# Patient Record
Sex: Male | Born: 1950 | Race: Black or African American | Hispanic: No | Marital: Married
Health system: Southern US, Community
[De-identification: ages and names within clinical notes are randomized; demographics above are authoritative.]

---

## 2001-11-25 ENCOUNTER — Ambulatory Visit (HOSPITAL_COMMUNITY): Admission: RE | Admit: 2001-11-25 | Discharge: 2001-11-25 | Payer: Self-pay | Admitting: *Deleted

## 2009-01-27 ENCOUNTER — Encounter: Admission: RE | Admit: 2009-01-27 | Discharge: 2009-01-27 | Payer: Self-pay | Admitting: Family Medicine

## 2012-07-04 ENCOUNTER — Telehealth: Payer: Self-pay | Admitting: Diagnostic Neuroimaging

## 2012-07-04 NOTE — Telephone Encounter (Deleted)
Pt requesting MRI results.

## 2012-07-05 NOTE — Telephone Encounter (Signed)
I called pt. Reviewed results. Conservative mgmt for now. Labs unremarkable. MRI lumbar spine shows multi-level disc bulging and biforaminal foraminal stenosis.   He may f/u in 6 months or as needed.

## 2012-11-19 ENCOUNTER — Ambulatory Visit: Payer: Self-pay | Admitting: Diagnostic Neuroimaging

## 2016-04-19 DIAGNOSIS — Z125 Encounter for screening for malignant neoplasm of prostate: Secondary | ICD-10-CM | POA: Diagnosis not present

## 2016-04-19 DIAGNOSIS — R7301 Impaired fasting glucose: Secondary | ICD-10-CM | POA: Diagnosis not present

## 2016-04-19 DIAGNOSIS — Z0000 Encounter for general adult medical examination without abnormal findings: Secondary | ICD-10-CM | POA: Diagnosis not present

## 2016-04-19 DIAGNOSIS — E785 Hyperlipidemia, unspecified: Secondary | ICD-10-CM | POA: Diagnosis not present

## 2016-04-19 DIAGNOSIS — R7611 Nonspecific reaction to tuberculin skin test without active tuberculosis: Secondary | ICD-10-CM | POA: Diagnosis not present

## 2016-04-19 DIAGNOSIS — Z23 Encounter for immunization: Secondary | ICD-10-CM | POA: Diagnosis not present

## 2016-06-02 DIAGNOSIS — K640 First degree hemorrhoids: Secondary | ICD-10-CM | POA: Diagnosis not present

## 2016-06-02 DIAGNOSIS — Z1211 Encounter for screening for malignant neoplasm of colon: Secondary | ICD-10-CM | POA: Diagnosis not present

## 2017-11-07 DIAGNOSIS — Z125 Encounter for screening for malignant neoplasm of prostate: Secondary | ICD-10-CM | POA: Diagnosis not present

## 2017-11-07 DIAGNOSIS — E785 Hyperlipidemia, unspecified: Secondary | ICD-10-CM | POA: Diagnosis not present

## 2017-11-07 DIAGNOSIS — Z23 Encounter for immunization: Secondary | ICD-10-CM | POA: Diagnosis not present

## 2017-11-07 DIAGNOSIS — Z1159 Encounter for screening for other viral diseases: Secondary | ICD-10-CM | POA: Diagnosis not present

## 2017-11-07 DIAGNOSIS — Z0000 Encounter for general adult medical examination without abnormal findings: Secondary | ICD-10-CM | POA: Diagnosis not present

## 2017-11-07 DIAGNOSIS — R7303 Prediabetes: Secondary | ICD-10-CM | POA: Diagnosis not present

## 2017-11-07 DIAGNOSIS — Z6837 Body mass index (BMI) 37.0-37.9, adult: Secondary | ICD-10-CM | POA: Diagnosis not present

## 2018-11-27 DIAGNOSIS — E785 Hyperlipidemia, unspecified: Secondary | ICD-10-CM | POA: Diagnosis not present

## 2018-11-27 DIAGNOSIS — Z0000 Encounter for general adult medical examination without abnormal findings: Secondary | ICD-10-CM | POA: Diagnosis not present

## 2018-11-27 DIAGNOSIS — R7303 Prediabetes: Secondary | ICD-10-CM | POA: Diagnosis not present

## 2018-11-27 DIAGNOSIS — Z125 Encounter for screening for malignant neoplasm of prostate: Secondary | ICD-10-CM | POA: Diagnosis not present

## 2018-11-28 DIAGNOSIS — E785 Hyperlipidemia, unspecified: Secondary | ICD-10-CM | POA: Diagnosis not present

## 2018-11-28 DIAGNOSIS — Z125 Encounter for screening for malignant neoplasm of prostate: Secondary | ICD-10-CM | POA: Diagnosis not present

## 2018-11-28 DIAGNOSIS — Z0000 Encounter for general adult medical examination without abnormal findings: Secondary | ICD-10-CM | POA: Diagnosis not present

## 2018-11-28 DIAGNOSIS — R7303 Prediabetes: Secondary | ICD-10-CM | POA: Diagnosis not present

## 2018-12-20 DIAGNOSIS — Z23 Encounter for immunization: Secondary | ICD-10-CM | POA: Diagnosis not present

## 2020-12-20 ENCOUNTER — Ambulatory Visit
Admission: RE | Admit: 2020-12-20 | Discharge: 2020-12-20 | Disposition: A | Discharge status: Home / Self Care | Payer: Self-pay | Source: Ambulatory Visit | Attending: Family Medicine | Admitting: Family Medicine

## 2020-12-20 ENCOUNTER — Other Ambulatory Visit: Payer: Self-pay | Admitting: Family Medicine

## 2020-12-20 DIAGNOSIS — R7303 Prediabetes: Secondary | ICD-10-CM | POA: Diagnosis not present

## 2020-12-20 DIAGNOSIS — Z0000 Encounter for general adult medical examination without abnormal findings: Secondary | ICD-10-CM | POA: Diagnosis not present

## 2020-12-20 DIAGNOSIS — Z9289 Personal history of other medical treatment: Secondary | ICD-10-CM

## 2020-12-20 DIAGNOSIS — E7800 Pure hypercholesterolemia, unspecified: Secondary | ICD-10-CM | POA: Diagnosis not present

## 2020-12-20 DIAGNOSIS — M25561 Pain in right knee: Secondary | ICD-10-CM | POA: Diagnosis not present

## 2020-12-20 DIAGNOSIS — R030 Elevated blood-pressure reading, without diagnosis of hypertension: Secondary | ICD-10-CM | POA: Diagnosis not present

## 2020-12-20 DIAGNOSIS — J439 Emphysema, unspecified: Secondary | ICD-10-CM | POA: Diagnosis not present

## 2020-12-20 DIAGNOSIS — Z1389 Encounter for screening for other disorder: Secondary | ICD-10-CM | POA: Diagnosis not present

## 2020-12-20 DIAGNOSIS — Z1211 Encounter for screening for malignant neoplasm of colon: Secondary | ICD-10-CM | POA: Diagnosis not present

## 2021-01-03 DIAGNOSIS — R3912 Poor urinary stream: Secondary | ICD-10-CM | POA: Diagnosis not present

## 2021-01-03 DIAGNOSIS — Z23 Encounter for immunization: Secondary | ICD-10-CM | POA: Diagnosis not present

## 2021-01-05 ENCOUNTER — Ambulatory Visit: Payer: Self-pay

## 2021-01-05 ENCOUNTER — Other Ambulatory Visit: Payer: Self-pay

## 2021-01-05 ENCOUNTER — Ambulatory Visit: Payer: Medicare Other | Admitting: Orthopedic Surgery

## 2021-01-05 ENCOUNTER — Encounter: Payer: Self-pay | Admitting: Orthopedic Surgery

## 2021-01-05 VITALS — Ht 69.5 in | Wt 222.0 lb

## 2021-01-05 DIAGNOSIS — M25561 Pain in right knee: Secondary | ICD-10-CM | POA: Diagnosis not present

## 2021-01-05 DIAGNOSIS — M1711 Unilateral primary osteoarthritis, right knee: Secondary | ICD-10-CM

## 2021-01-05 NOTE — Progress Notes (Signed)
Office Visit Note   Patient: Gordon Jones           Date of Birth: [birth year]           MRN: [id number] Visit Date: 01/05/2021 Requested by: Antony Contras, MD [place],  [place] [address] PCP: Antony Contras, MD  Subjective: Chief Complaint  Patient presents with   Right Knee - Pain    HPI: Gordon Jones is a 70 y.o. male who presents to the office complaining of right knee pain.  Report pain is been ongoing for 1 year.  Localizes pain to the medial aspect of the knee.  Denies mechanical symptoms or feelings of instability.  Denies any groin pain or low back pain with radicular symptoms.  Patient takes over-the-counter Tylenol as well as topical Voltaren with little relief.  Patient reports history of no prior knee surgery, injection, physical therapy.  Pain does not wake them up at night and does not bother them at rest.  They deny any history of diabetes, smoking, diabetes, blood thinner use, steroids, CKD..  Additionally, patient enjoys walking as well as going to car shows.  He has a vintage car that he enjoys working on.  He walks with a group of friends and also go to car shows with him.  He does not have any problems that limit his activity but he does note swelling and pain that depends on his activity.              ROS:  All systems reviewed are negative as they relate to the chief complaint within the history of present illness.  Patient denies fevers or chills.  Assessment & Plan: Visit Diagnoses:  1. Right knee pain, unspecified chronicity     Plan: Gordon Jones is a 70 y.o. male who presents to the office complaining of right knee pain.  Pain began 1 year ago when he missed a step while walking down stairs and began to have right medial knee pain and swelling.  He notes swelling immediately after the initial injury.  His pain did somewhat improve over time to the point where he did not consider further intervention.  However now that the  symptoms have been ongoing for a year, he is somewhat concerned and would like further work-up.  Radiographs taken today do demonstrate medial and patellofemoral compartment osteoarthritis rated moderate.  He has a large effusion on exam.   Discussed the options available to patient.  After discussion he would like to try injection today.  Tolerated the injection well.  35 cc of inflammatory joint fluid was aspirated.  Preapproved patient for right knee gel injection.  Follow-up in 3 months for clinical recheck with repeat cortisone injection versus gel injection at that time.  This patient is diagnosed with osteoarthritis of the knee(s).    Radiographs show evidence of joint space narrowing, osteophytes, subchondral sclerosis and/or subchondral cysts.  This patient has knee pain which interferes with functional and activities of daily living.    This patient has experienced inadequate response, adverse effects and/or intolerance with conservative treatments such as acetaminophen, NSAIDS, topical creams, physical therapy or regular exercise, knee bracing and/or weight loss.   This patient has experienced inadequate response or has a contraindication to intra articular steroid injections for at least 3 months.   This patient is not scheduled to have a total knee replacement within 6 months of starting treatment with viscosupplementation.   Follow-Up Instructions: No follow-ups  on file.   Orders:  Orders Placed This Encounter  Procedures   XR Knee 1-2 Views Right   No orders of the defined types were placed in this encounter.     Procedures: Large Joint Inj: R knee on 01/05/2021 12:23 PM Indications: diagnostic evaluation, joint swelling and pain Details: 18 G 1.5 in needle, superolateral approach  Arthrogram: No  Medications: 5 mL lidocaine 1 %; 40 mg methylPREDNISolone acetate 40 MG/ML; 4 mL bupivacaine 0.25 % Aspirate: 35 mL Outcome: tolerated well, no immediate  complications Procedure, treatment alternatives, risks and benefits explained, specific risks discussed. Consent was given by the patient. Immediately prior to procedure a time out was called to verify the correct patient, procedure, equipment, support staff and site/side marked as required. Patient was prepped and draped in the usual sterile fashion.      Clinical Data: No additional findings.  Objective: Vital Signs: Ht 5' 9.5" (1.765 m)   Wt 222 lb (100.7 kg)   BMI 32.31 kg/m   Physical Exam:  Constitutional: Patient appears well-developed HEENT:  Head: Normocephalic Eyes:EOM are normal Neck: Normal range of motion Cardiovascular: Normal rate Pulmonary/chest: Effort normal Neurologic: Patient is alert Skin: Skin is warm Psychiatric: Patient has normal mood and affect  Ortho Exam:  Right knee Exam Moderate effusion Extensor mechanism intact Tender to palpation moderately over the medial joint line No TTP over the lateral jointlines, quad tendon, patellar tendon, pes anserinus, patella, tibial tubercle, LCL/MCL insertions Stable to varus/valgus stresses.  Stable to anterior/posterior drawer Extension to 0 degrees Flexion to 120 degrees No pain with hip range of motion.  Negative straight leg raise.  Specialty Comments:  No specialty comments available.  Imaging: No results found.   PMFS History: There are no problems to display for this patient.  No past medical history on file.  No family history on file.   Social History   Occupational History   Not on file  Tobacco Use   Smoking status: Not on file   Smokeless tobacco: Not on file  Substance and Sexual Activity   Alcohol use: Not on file   Drug use: Not on file   Sexual activity: Not on file

## 2021-01-06 ENCOUNTER — Telehealth: Payer: Self-pay

## 2021-01-06 NOTE — Telephone Encounter (Signed)
Noted  

## 2021-01-06 NOTE — Telephone Encounter (Signed)
Can we get patient approved for right knee gel injection? 

## 2021-01-10 MED ORDER — BUPIVACAINE HCL 0.25 % IJ SOLN
4.0000 mL | INTRAMUSCULAR | Status: AC | PRN
  Administered 2021-01-05: 4 mL via INTRA_ARTICULAR

## 2021-01-10 MED ORDER — METHYLPREDNISOLONE ACETATE 40 MG/ML IJ SUSP
40.0000 mg | INTRAMUSCULAR | Status: AC | PRN
  Administered 2021-01-05: 40 mg via INTRA_ARTICULAR

## 2021-01-10 MED ORDER — LIDOCAINE HCL 1 % IJ SOLN
5.0000 mL | INTRAMUSCULAR | Status: AC | PRN
  Administered 2021-01-05: 5 mL

## 2021-03-02 ENCOUNTER — Telehealth: Payer: Self-pay

## 2021-03-02 NOTE — Telephone Encounter (Signed)
VOB submitted for  Durolane, right knee. °BV pending. °

## 2021-03-07 ENCOUNTER — Telehealth: Payer: Self-pay

## 2021-03-07 NOTE — Telephone Encounter (Addendum)
Approved for Durolane, right knee. [place] Patient will be responsible for 20% OOP. Co-pay of $20.00 No PA required  Appt. 04/13/2021

## 2021-04-13 ENCOUNTER — Encounter: Payer: Self-pay | Admitting: Orthopedic Surgery

## 2021-04-13 ENCOUNTER — Ambulatory Visit: Payer: Medicare Other | Admitting: Orthopedic Surgery

## 2021-04-13 ENCOUNTER — Other Ambulatory Visit: Payer: Self-pay

## 2021-04-13 DIAGNOSIS — M1711 Unilateral primary osteoarthritis, right knee: Secondary | ICD-10-CM | POA: Diagnosis not present

## 2021-04-13 MED ORDER — LIDOCAINE HCL 1 % IJ SOLN
5.0000 mL | INTRAMUSCULAR | Status: AC | PRN
  Administered 2021-04-13: 5 mL

## 2021-04-13 MED ORDER — SODIUM HYALURONATE 60 MG/3ML IX PRSY
60.0000 mg | PREFILLED_SYRINGE | INTRA_ARTICULAR | Status: AC | PRN
  Administered 2021-04-13: 60 mg via INTRA_ARTICULAR

## 2021-04-13 NOTE — Progress Notes (Signed)
° °  Procedure Note  Patient: BRAELIN COSTLOW             Date of Birth: November 05, 1950           MRN: [id number]             Visit Date: 04/13/2021  Procedures: Visit Diagnoses:  1. Unilateral primary osteoarthritis, right knee     Large Joint Inj: R knee on 04/13/2021 1:21 PM Indications: diagnostic evaluation, joint swelling and pain Details: 18 G 1.5 in needle, superolateral approach  Arthrogram: No  Medications: 5 mL lidocaine 1 %; 60 mg Sodium Hyaluronate 60 MG/3ML Outcome: tolerated well, no immediate complications Procedure, treatment alternatives, risks and benefits explained, specific risks discussed. Consent was given by the patient. Immediately prior to procedure a time out was called to verify the correct patient, procedure, equipment, support staff and site/side marked as required. Patient was prepped and draped in the usual sterile fashion.   Zyeir is a patient with right knee pain and medial compartment arthritis.  Here for Durolane injection.  He does walk a lot.  Does not want knee replacement.  Pain is not unbearable.  Plan for Durolane injection today with aspiration of about 35 cc.  Come back in 3 months for repeat aspiration and cortisone injection

## 2021-04-29 ENCOUNTER — Telehealth: Payer: Self-pay | Admitting: Radiation Oncology

## 2021-04-29 NOTE — Telephone Encounter (Signed)
Called patient to schedule a consultation w. Dr. Manning. No answer, LVM for a return call.  ?

## 2021-05-19 NOTE — Progress Notes (Signed)
GU Location of Tumor / Histology: Prostate Ca  If Prostate Cancer, Gleason Score is (4 + 3) and PSA is (4.18 as of 12/2020).  Biopsies  Dr. Lovena Neighbours     Past/Anticipated interventions by urology, if any:   Past/Anticipated interventions by medical oncology, if any:   Weight changes, if any:  No  IPSS:  5 SHIM:   7  Bowel/Bladder complaints, if any:  No bowel or bladder  Nausea/Vomiting, if any: No  Pain issues, if any:  0/10  SAFETY ISSUES: Prior radiation? No Pacemaker/ICD?  No Possible current pregnancy?  Male Is the patient on methotrexate? No  Current Complaints / other details:

## 2021-05-23 ENCOUNTER — Ambulatory Visit
Admission: RE | Admit: 2021-05-23 | Discharge: 2021-05-23 | Disposition: A | Discharge status: Home / Self Care | Payer: Medicare Other | Source: Ambulatory Visit | Attending: Radiation Oncology | Admitting: Radiation Oncology

## 2021-05-23 ENCOUNTER — Other Ambulatory Visit: Payer: Self-pay

## 2021-05-23 VITALS — BP 165/81 | HR 71 | Temp 98.3°F | Resp 18 | Ht 70.0 in | Wt 237.6 lb

## 2021-05-23 DIAGNOSIS — Z79899 Other long term (current) drug therapy: Secondary | ICD-10-CM | POA: Diagnosis not present

## 2021-05-23 DIAGNOSIS — E785 Hyperlipidemia, unspecified: Secondary | ICD-10-CM | POA: Insufficient documentation

## 2021-05-23 DIAGNOSIS — Z6837 Body mass index (BMI) 37.0-37.9, adult: Secondary | ICD-10-CM | POA: Insufficient documentation

## 2021-05-23 DIAGNOSIS — C61 Malignant neoplasm of prostate: Secondary | ICD-10-CM

## 2021-05-23 DIAGNOSIS — R7611 Nonspecific reaction to tuberculin skin test without active tuberculosis: Secondary | ICD-10-CM | POA: Insufficient documentation

## 2021-05-23 DIAGNOSIS — R7301 Impaired fasting glucose: Secondary | ICD-10-CM | POA: Insufficient documentation

## 2021-05-23 DIAGNOSIS — E7800 Pure hypercholesterolemia, unspecified: Secondary | ICD-10-CM | POA: Insufficient documentation

## 2021-05-23 DIAGNOSIS — R7303 Prediabetes: Secondary | ICD-10-CM | POA: Insufficient documentation

## 2021-05-23 DIAGNOSIS — R209 Unspecified disturbances of skin sensation: Secondary | ICD-10-CM | POA: Insufficient documentation

## 2021-05-23 NOTE — Progress Notes (Signed)
Introduced myself to the patient as the prostate nurse navigator.  No barriers to care identified at this time.  He is here to discuss his radiation treatment options.  I gave him my business card and asked him to call me with questions or concerns.  Verbalized understanding.  ?

## 2021-05-23 NOTE — Progress Notes (Signed)
Radiation Oncology         [phone] ________________________________  Initial Outpatient Consultation  Name: Gordon Jones MRN: [id number]  Date: 05/23/2021  DOB: [birth year]  JS:[email or web address], Shanon Brow, MD  [facility]ourd*   REFERRING PHYSICIAN: [facility]ourd*  DIAGNOSIS: 71 y.o. gentleman with Stage T1c adenocarcinoma of the prostate with Gleason score of 3+4, and PSA of 4.18.    ICD-10-CM   1. Prostate cancer [facility])  C61       HISTORY OF PRESENT ILLNESS: Gordon Jones is a 71 y.o. male with a diagnosis of prostate cancer. He was noted to have an elevated PSA of 4.18 by his primary care physician, Dr. Moreen Fowler.  Accordingly, he was referred for evaluation in urology by Dr. Lovena Neighbours on 01/03/21,  digital rectal examination was performed at that time revealing no nodules.  The patient proceeded to transrectal ultrasound with 12 biopsies of the prostate on 04/21/21.  The prostate volume measured 46 cc.  Out of 12 core biopsies, 3 were positive.  The maximum Gleason score was 3+4, and this was seen in Left lateral base, left base and left mid.  The patient reviewed the biopsy results with his urologist and he has kindly been referred today for discussion of potential radiation treatment options.   PREVIOUS RADIATION THERAPY: No  PAST MEDICAL HISTORY: No past medical history on file.    PAST SURGICAL HISTORY:No past surgical history on file.  FAMILY HISTORY: No family history on file.  SOCIAL HISTORY:  Social History   Socioeconomic History   Marital status: Married    Spouse name: Not on file   Number of children: Not on file   Years of education: Not on file   Highest education level: Not on file  Occupational History   Not on file  Tobacco Use   Smoking status: Not on file   Smokeless tobacco: Not on file  Substance and Sexual Activity   Alcohol use: Not on file   Drug use: Not on file   Sexual activity: Not on file  Other Topics Concern   Not on file   Social History Narrative   Not on file   Social Determinants of Health   Financial Resource Strain: Not on file  Food Insecurity: Not on file  Transportation Needs: Not on file  Physical Activity: Not on file  Stress: Not on file  Social Connections: Not on file  Intimate Partner Violence: Not on file    ALLERGIES: Simvastatin  MEDICATIONS:  Current Outpatient Medications  Medication Sig Dispense Refill   aspirin (ASPIRIN LOW DOSE) 81 MG EC tablet 1 tablet     atorvastatin (LIPITOR) 10 MG tablet Take 10 mg by mouth daily.     Multiple Vitamins-Minerals (MULTIVITAMINS/MINERALS ADULT PO) 1 tablet     tadalafil (CIALIS) 5 MG tablet SMARTSIG:1-4 Tablet(s) By Mouth PRN     tamsulosin (FLOMAX) 0.4 MG CAPS capsule Take 0.4 mg by mouth daily.     No current facility-administered medications for this encounter.    REVIEW OF SYSTEMS:  On review of systems, the patient reports that he is doing well overall. He denies any chest pain, shortness of breath, cough, fevers, chills, night sweats, unintended weight changes. He denies any bowel disturbances, and denies abdominal pain, nausea or vomiting. He denies any new musculoskeletal or joint aches or pains. His IPSS was Total Score: 5, indicating mild urinary symptoms. His SHIM: 7, indicating he has erectile dysfunction. A complete review of systems is obtained  and is otherwise negative.   PHYSICAL EXAM:  Wt Readings from Last 3 Encounters:  05/23/21 237 lb 9.6 oz (107.8 kg)  01/05/21 222 lb (100.7 kg)   Temp Readings from Last 3 Encounters:  05/23/21 98.3 F (36.8 C) (Oral)   BP Readings from Last 3 Encounters:  05/23/21 (!) 165/81   Pulse Readings from Last 3 Encounters:  05/23/21 71   Pain Assessment Pain Score: 0-No pain/10  In general this is a well appearing gentleman in no acute distress. He's alert and oriented x4 and appropriate throughout the examination. Cardiopulmonary assessment is negative for acute distress, and he  exhibits normal effort.    KPS = 100  100 - Normal; no complaints; no evidence of disease. 90   - Able to carry on normal activity; minor signs or symptoms of disease. 80   - Normal activity with effort; some signs or symptoms of disease. 56   - Cares for self; unable to carry on normal activity or to do active work. 60   - Requires occasional assistance, but is able to care for most of his personal needs. 50   - Requires considerable assistance and frequent medical care. 63   - Disabled; requires special care and assistance. 49   - Severely disabled; hospital admission is indicated although death not imminent. 37   - Very sick; hospital admission necessary; active supportive treatment necessary. 10   - Moribund; fatal processes progressing rapidly. 0     - Dead  Karnofsky DA, Abelmann WH, Craver LS and Burchenal JH [phone]) The use of the nitrogen mustards in the palliative treatment of carcinoma: with particular reference to bronchogenic carcinoma Cancer 1 634-56  LABORATORY DATA:  No results found for: WBC, HGB, HCT, MCV, PLT No results found for: NA, K, CL, CO2 No results found for: ALT, AST, GGT, ALKPHOS, BILITOT   RADIOGRAPHY: No results found.    IMPRESSION/PLAN: 1. 71 y.o. gentleman with Stage T1c adenocarcinoma of the prostate with Gleason score of 3+4, and PSA of 4.18.  We discussed the patient's workup and outlined the nature of prostate cancer in this setting. The patient's T stage, Gleason's score, and PSA put him into the Favorable Intermediate risk group. Accordingly, he is eligible for a variety of potential treatment options including brachytherapy, 5.5 weeks of external radiation, or prostatectomy. We discussed the available radiation techniques, and focused on the details and logistics of delivery.  We discussed and outlined the risks, benefits, short and long-term effects associated with radiotherapy and compared and contrasted these with prostatectomy. We discussed the  role of SpaceOAR gel in reducing the rectal toxicity associated with radiotherapy.   He appears to have a good understanding of his disease and our treatment recommendations which are of curative intent.  He was encouraged to ask questions that were answered to his stated satisfaction.  At the conclusion of our conversation, the patient is interested in moving forward with urology consult with Dr. Louis Meckel on 06/06/20.  He'll be ready to decide after that.  We personally spent 60 minutes in this encounter including chart review, reviewing radiological studies, meeting face-to-face with the patient, entering orders and completing documentation.      Tyler Pita, MD  [facility]   Radiation Oncology Direct Dial: [phone]   Fax: [phone] [email or web address]   Skype   LinkedIn        I don

## 2021-06-14 NOTE — Progress Notes (Signed)
Left message for call back regarding treatment decision.   ?

## 2021-06-17 ENCOUNTER — Other Ambulatory Visit: Payer: Self-pay

## 2021-06-17 DIAGNOSIS — C61 Malignant neoplasm of prostate: Secondary | ICD-10-CM

## 2021-06-20 NOTE — Progress Notes (Signed)
Patient has decided to proceed with referral to [facility] for high dose brachytherapy with Dr. Tharon Aquas.  Referral faxed successfully to [phone].  Pt aware, RN will follow up to ensure appointment is made.  ?

## 2021-06-24 NOTE — Progress Notes (Signed)
RN placed call to Dr. Corinna Gab office at [facility] to check on status of referral for HDR.  ? ?Message sent to New Patient Coordinator, and referral re-faxed per requested from staff.   ? ?Dr. Corinna Gab office will call back once reviewed and scheduled.   ? ?Will continue to follow up.  ?

## 2021-06-27 NOTE — Progress Notes (Signed)
Patient scheduled/aware of HDR Consult with Dr. Caren Griffins on 07/05/2021.  ? ?Patient agreeable with RN following up after consult to ensure final treatment decision.   No further needs at this time.  ?

## 2021-07-05 DIAGNOSIS — Z888 Allergy status to other drugs, medicaments and biological substances status: Secondary | ICD-10-CM | POA: Diagnosis not present

## 2021-07-07 NOTE — Progress Notes (Signed)
Patient has consult with Dr. Caren Griffins @ [facility] on 3/28 to be consider for HDR brachytherapy.  ? ?RN left voicemail for call back to review treatment decision.  ?

## 2021-07-13 ENCOUNTER — Ambulatory Visit (INDEPENDENT_AMBULATORY_CARE_PROVIDER_SITE_OTHER): Payer: Medicare Other | Admitting: Surgical

## 2021-07-13 ENCOUNTER — Encounter: Payer: Self-pay | Admitting: Orthopedic Surgery

## 2021-07-13 DIAGNOSIS — M1711 Unilateral primary osteoarthritis, right knee: Secondary | ICD-10-CM

## 2021-07-13 MED ORDER — LIDOCAINE HCL 1 % IJ SOLN
5.0000 mL | INTRAMUSCULAR | Status: AC | PRN
  Administered 2021-07-13: 5 mL

## 2021-07-13 MED ORDER — METHYLPREDNISOLONE ACETATE 40 MG/ML IJ SUSP
40.0000 mg | INTRAMUSCULAR | Status: AC | PRN
  Administered 2021-07-13: 40 mg via INTRA_ARTICULAR

## 2021-07-13 MED ORDER — BUPIVACAINE HCL 0.25 % IJ SOLN
4.0000 mL | INTRAMUSCULAR | Status: AC | PRN
  Administered 2021-07-13: 4 mL via INTRA_ARTICULAR

## 2021-07-13 NOTE — Progress Notes (Signed)
? ?Office Visit Note ?  ?Patient: Gordon Jones           ?Date of Birth: [birth year]           ?MRN: [id number] ?Visit Date: 07/13/2021 ?Requested by: Antony Contras, MD ?[address] ?[address] ?[place],  [place] [address] ?PCP: Antony Contras, MD ? ?Subjective: ?Chief Complaint  ?Patient presents with  ? Right Knee - Follow-up  ? ? ?HPI: Gordon Jones is a 71 y.o. male who presents to the office complaining of right knee soreness and fluid.  He notes continued issues with swelling.  He had last injection to the right knee which was a gel injection by Dr. Marlou Sa on 04/13/2021 and did not really make any difference for his symptoms.  Pain is not debilitating for him and he really only has mild to moderate soreness at times that is based on his activity and based on if he hits his knee into something.  No mechanical symptoms.  No instability.  Denies any groin pain or radicular pain.  His main complaint is that the knee continues to swell up.  He enjoys walking with his family members and friends in order to catch up and walk several times per week with them..  States that after aspiration, fluid slowly reaccumulates. ?             ?ROS: All systems reviewed are negative as they relate to the chief complaint within the history of present illness.  Patient denies fevers or chills. ? ?Assessment & Plan: ?Visit Diagnoses:  ?1. Unilateral primary osteoarthritis, right knee   ? ? ?Plan: Patient is a 71 year old male who presents for evaluation of right knee pain and swelling.  Main complaint is the swelling that is present.  He has history of moderate arthritis of the right knee is worse than the left.  Left knee not giving him any trouble.  He would like to try aspiration of the right knee today with cortisone injection.  Tolerated the injection well.  45 cc of nonpurulent synovial fluid was aspirated.  He will follow-up with the office as needed.  This is not bothering him enough for any sort of surgical  intervention. ? ?Follow-Up Instructions: No follow-ups on file.  ? ?Orders:  ?No orders of the defined types were placed in this encounter. ? ?No orders of the defined types were placed in this encounter. ? ? ? ? Procedures: ?Large Joint Inj: R knee on 07/13/2021 1:04 PM ?Indications: diagnostic evaluation, joint swelling and pain ?Details: 18 G 1.5 in needle, superolateral approach ? ?Arthrogram: No ? ?Medications: 5 mL lidocaine 1 %; 40 mg methylPREDNISolone acetate 40 MG/ML; 4 mL bupivacaine 0.25 % ?Aspirate: 45 mL ?Outcome: tolerated well, no immediate complications ?Procedure, treatment alternatives, risks and benefits explained, specific risks discussed. Consent was given by the patient. Immediately prior to procedure a time out was called to verify the correct patient, procedure, equipment, support staff and site/side marked as required. Patient was prepped and draped in the usual sterile fashion.  ? ? ? ? ?Clinical Data: ?No additional findings. ? ?Objective: ?Vital Signs: There were no vitals taken for this visit. ? ?Physical Exam:  ?Constitutional: Patient appears well-developed ?HEENT:  ?Head: Normocephalic ?Eyes:EOM are normal ?Neck: Normal range of motion ?Cardiovascular: Normal rate ?Pulmonary/chest: Effort normal ?Neurologic: Patient is alert ?Skin: Skin is warm ?Psychiatric: Patient has normal mood and affect ? ?Ortho Exam: Ortho exam demonstrates right knee with large effusion.  Tenderness over the  medial joint line mildly.  No tenderness over the lateral joint line.  Range of motion from 0 degrees extension to 110 degrees of knee flexion.  No calf tenderness.  Negative Homans' sign.  No pain with hip range of motion.  No laxity to anterior posterior drawer.  Able to perform straight leg raise without extensor lag. ? ?Specialty Comments:  ?No specialty comments available. ? ?Imaging: ?No results found. ? ? ?PMFS History: ?Patient Active Problem List  ? Diagnosis Date Noted  ? Hyperlipidemia 05/23/2021   ? Impaired fasting glucose 05/23/2021  ? Morbid obesity ([place]) 05/23/2021  ? Nonspecific tuberculin test reaction 05/23/2021  ? Prediabetes 05/23/2021  ? Prostate cancer ([place]) 05/23/2021  ? Pure hypercholesterolemia 05/23/2021  ? Skin sensation disturbance 05/23/2021  ? Body mass index (BMI) 37.0-37.9, adult 05/23/2021  ? ?No past medical history on file.  ?No family history on file.  ?No past surgical history on file. ?Social History  ? ?Occupational History  ? Not on file  ?Tobacco Use  ? Smoking status: Not on file  ? Smokeless tobacco: Not on file  ?Substance and Sexual Activity  ? Alcohol use: Not on file  ? Drug use: Not on file  ? Sexual activity: Not on file  ? ? ? ? ?  ?

## 2021-07-18 NOTE — Progress Notes (Signed)
Pt recently had consult at [facility] with Dr. Caren Griffins to be evaluated for HDR per patient request.  ? ?RN spoke with patient to follow up after consult.  Per pt he does not qualify for HDR due to PSA and Gleason score.  Patient did confirm he will proceed with brachytherapy at [facility] as they utilize seed implants that are linked together vs individual.  Pt verbalized concerns of seed migration.  RN provided education, and encouraged him to let us know if we could be any assistance to him to reach out.  Verbalized understanding, and thankfulness.  ?

## 2021-07-25 DIAGNOSIS — H40053 Ocular hypertension, bilateral: Secondary | ICD-10-CM | POA: Diagnosis not present

## 2021-07-25 DIAGNOSIS — H5202 Hypermetropia, left eye: Secondary | ICD-10-CM | POA: Diagnosis not present

## 2021-07-25 DIAGNOSIS — H524 Presbyopia: Secondary | ICD-10-CM | POA: Diagnosis not present

## 2021-07-25 DIAGNOSIS — H5211 Myopia, right eye: Secondary | ICD-10-CM | POA: Diagnosis not present

## 2021-07-25 DIAGNOSIS — H52223 Regular astigmatism, bilateral: Secondary | ICD-10-CM | POA: Diagnosis not present

## 2021-07-25 DIAGNOSIS — H40013 Open angle with borderline findings, low risk, bilateral: Secondary | ICD-10-CM | POA: Diagnosis not present

## 2021-10-05 DIAGNOSIS — Z510 Encounter for antineoplastic radiation therapy: Secondary | ICD-10-CM | POA: Diagnosis not present

## 2021-10-07 DIAGNOSIS — K5900 Constipation, unspecified: Secondary | ICD-10-CM | POA: Diagnosis not present

## 2021-11-08 DIAGNOSIS — Z510 Encounter for antineoplastic radiation therapy: Secondary | ICD-10-CM | POA: Diagnosis not present

## 2021-11-09 DIAGNOSIS — Z923 Personal history of irradiation: Secondary | ICD-10-CM | POA: Diagnosis not present

## 2021-11-09 DIAGNOSIS — N472 Paraphimosis: Secondary | ICD-10-CM | POA: Diagnosis not present

## 2021-11-11 DIAGNOSIS — Z510 Encounter for antineoplastic radiation therapy: Secondary | ICD-10-CM | POA: Diagnosis not present

## 2021-11-21 DIAGNOSIS — H524 Presbyopia: Secondary | ICD-10-CM | POA: Diagnosis not present

## 2021-11-21 DIAGNOSIS — H40023 Open angle with borderline findings, high risk, bilateral: Secondary | ICD-10-CM | POA: Diagnosis not present

## 2021-11-21 DIAGNOSIS — H52223 Regular astigmatism, bilateral: Secondary | ICD-10-CM | POA: Diagnosis not present

## 2022-01-16 DIAGNOSIS — Z0000 Encounter for general adult medical examination without abnormal findings: Secondary | ICD-10-CM | POA: Diagnosis not present

## 2022-01-16 DIAGNOSIS — Z9289 Personal history of other medical treatment: Secondary | ICD-10-CM | POA: Diagnosis not present

## 2022-01-16 DIAGNOSIS — E7800 Pure hypercholesterolemia, unspecified: Secondary | ICD-10-CM | POA: Diagnosis not present

## 2022-01-16 DIAGNOSIS — Z23 Encounter for immunization: Secondary | ICD-10-CM | POA: Diagnosis not present

## 2022-01-16 DIAGNOSIS — Z1211 Encounter for screening for malignant neoplasm of colon: Secondary | ICD-10-CM | POA: Diagnosis not present

## 2022-01-16 DIAGNOSIS — R030 Elevated blood-pressure reading, without diagnosis of hypertension: Secondary | ICD-10-CM | POA: Diagnosis not present

## 2022-01-16 DIAGNOSIS — R7303 Prediabetes: Secondary | ICD-10-CM | POA: Diagnosis not present

## 2022-01-16 DIAGNOSIS — R945 Abnormal results of liver function studies: Secondary | ICD-10-CM | POA: Diagnosis not present

## 2022-01-16 DIAGNOSIS — M545 Low back pain, unspecified: Secondary | ICD-10-CM | POA: Diagnosis not present

## 2022-01-27 DIAGNOSIS — M5416 Radiculopathy, lumbar region: Secondary | ICD-10-CM | POA: Diagnosis not present

## 2022-01-27 DIAGNOSIS — M25561 Pain in right knee: Secondary | ICD-10-CM | POA: Diagnosis not present

## 2022-01-27 DIAGNOSIS — M545 Low back pain, unspecified: Secondary | ICD-10-CM | POA: Diagnosis not present

## 2022-01-27 DIAGNOSIS — M479 Spondylosis, unspecified: Secondary | ICD-10-CM | POA: Diagnosis not present

## 2022-02-13 DIAGNOSIS — M4316 Spondylolisthesis, lumbar region: Secondary | ICD-10-CM | POA: Diagnosis not present

## 2022-02-13 DIAGNOSIS — M48061 Spinal stenosis, lumbar region without neurogenic claudication: Secondary | ICD-10-CM | POA: Diagnosis not present

## 2022-02-13 DIAGNOSIS — M5116 Intervertebral disc disorders with radiculopathy, lumbar region: Secondary | ICD-10-CM | POA: Diagnosis not present

## 2022-03-10 DIAGNOSIS — M48061 Spinal stenosis, lumbar region without neurogenic claudication: Secondary | ICD-10-CM | POA: Diagnosis not present

## 2022-03-10 DIAGNOSIS — M479 Spondylosis, unspecified: Secondary | ICD-10-CM | POA: Diagnosis not present

## 2022-03-15 DIAGNOSIS — M1711 Unilateral primary osteoarthritis, right knee: Secondary | ICD-10-CM | POA: Diagnosis not present

## 2022-08-10 DIAGNOSIS — Z923 Personal history of irradiation: Secondary | ICD-10-CM | POA: Diagnosis not present

## 2023-02-22 DIAGNOSIS — Z1211 Encounter for screening for malignant neoplasm of colon: Secondary | ICD-10-CM | POA: Diagnosis not present

## 2023-02-22 DIAGNOSIS — R030 Elevated blood-pressure reading, without diagnosis of hypertension: Secondary | ICD-10-CM | POA: Diagnosis not present

## 2023-02-22 DIAGNOSIS — Z23 Encounter for immunization: Secondary | ICD-10-CM | POA: Diagnosis not present

## 2023-02-22 DIAGNOSIS — E7800 Pure hypercholesterolemia, unspecified: Secondary | ICD-10-CM | POA: Diagnosis not present

## 2023-02-22 DIAGNOSIS — J439 Emphysema, unspecified: Secondary | ICD-10-CM | POA: Diagnosis not present

## 2023-02-22 DIAGNOSIS — Z0000 Encounter for general adult medical examination without abnormal findings: Secondary | ICD-10-CM | POA: Diagnosis not present

## 2023-02-22 DIAGNOSIS — R7303 Prediabetes: Secondary | ICD-10-CM | POA: Diagnosis not present

## 2023-02-22 DIAGNOSIS — Z9289 Personal history of other medical treatment: Secondary | ICD-10-CM | POA: Diagnosis not present

## 2023-02-26 DIAGNOSIS — H5211 Myopia, right eye: Secondary | ICD-10-CM | POA: Diagnosis not present

## 2023-02-26 DIAGNOSIS — H40013 Open angle with borderline findings, low risk, bilateral: Secondary | ICD-10-CM | POA: Diagnosis not present

## 2023-02-26 DIAGNOSIS — H43392 Other vitreous opacities, left eye: Secondary | ICD-10-CM | POA: Diagnosis not present

## 2023-02-26 DIAGNOSIS — H2513 Age-related nuclear cataract, bilateral: Secondary | ICD-10-CM | POA: Diagnosis not present

## 2023-02-26 DIAGNOSIS — H53143 Visual discomfort, bilateral: Secondary | ICD-10-CM | POA: Diagnosis not present

## 2023-02-26 DIAGNOSIS — H524 Presbyopia: Secondary | ICD-10-CM | POA: Diagnosis not present

## 2023-02-26 DIAGNOSIS — H5202 Hypermetropia, left eye: Secondary | ICD-10-CM | POA: Diagnosis not present

## 2023-02-26 DIAGNOSIS — H40053 Ocular hypertension, bilateral: Secondary | ICD-10-CM | POA: Diagnosis not present

## 2023-02-26 DIAGNOSIS — H52223 Regular astigmatism, bilateral: Secondary | ICD-10-CM | POA: Diagnosis not present

## 2023-03-24 IMAGING — CR DG CHEST 2V
2 series · 2 of 2 positions shown · non-contrast
Comparison: 04/19/2016

CLINICAL DATA: 70-year-old male with history of positive PPD

EXAM:
CHEST - 2 VIEW

[w chest pa]
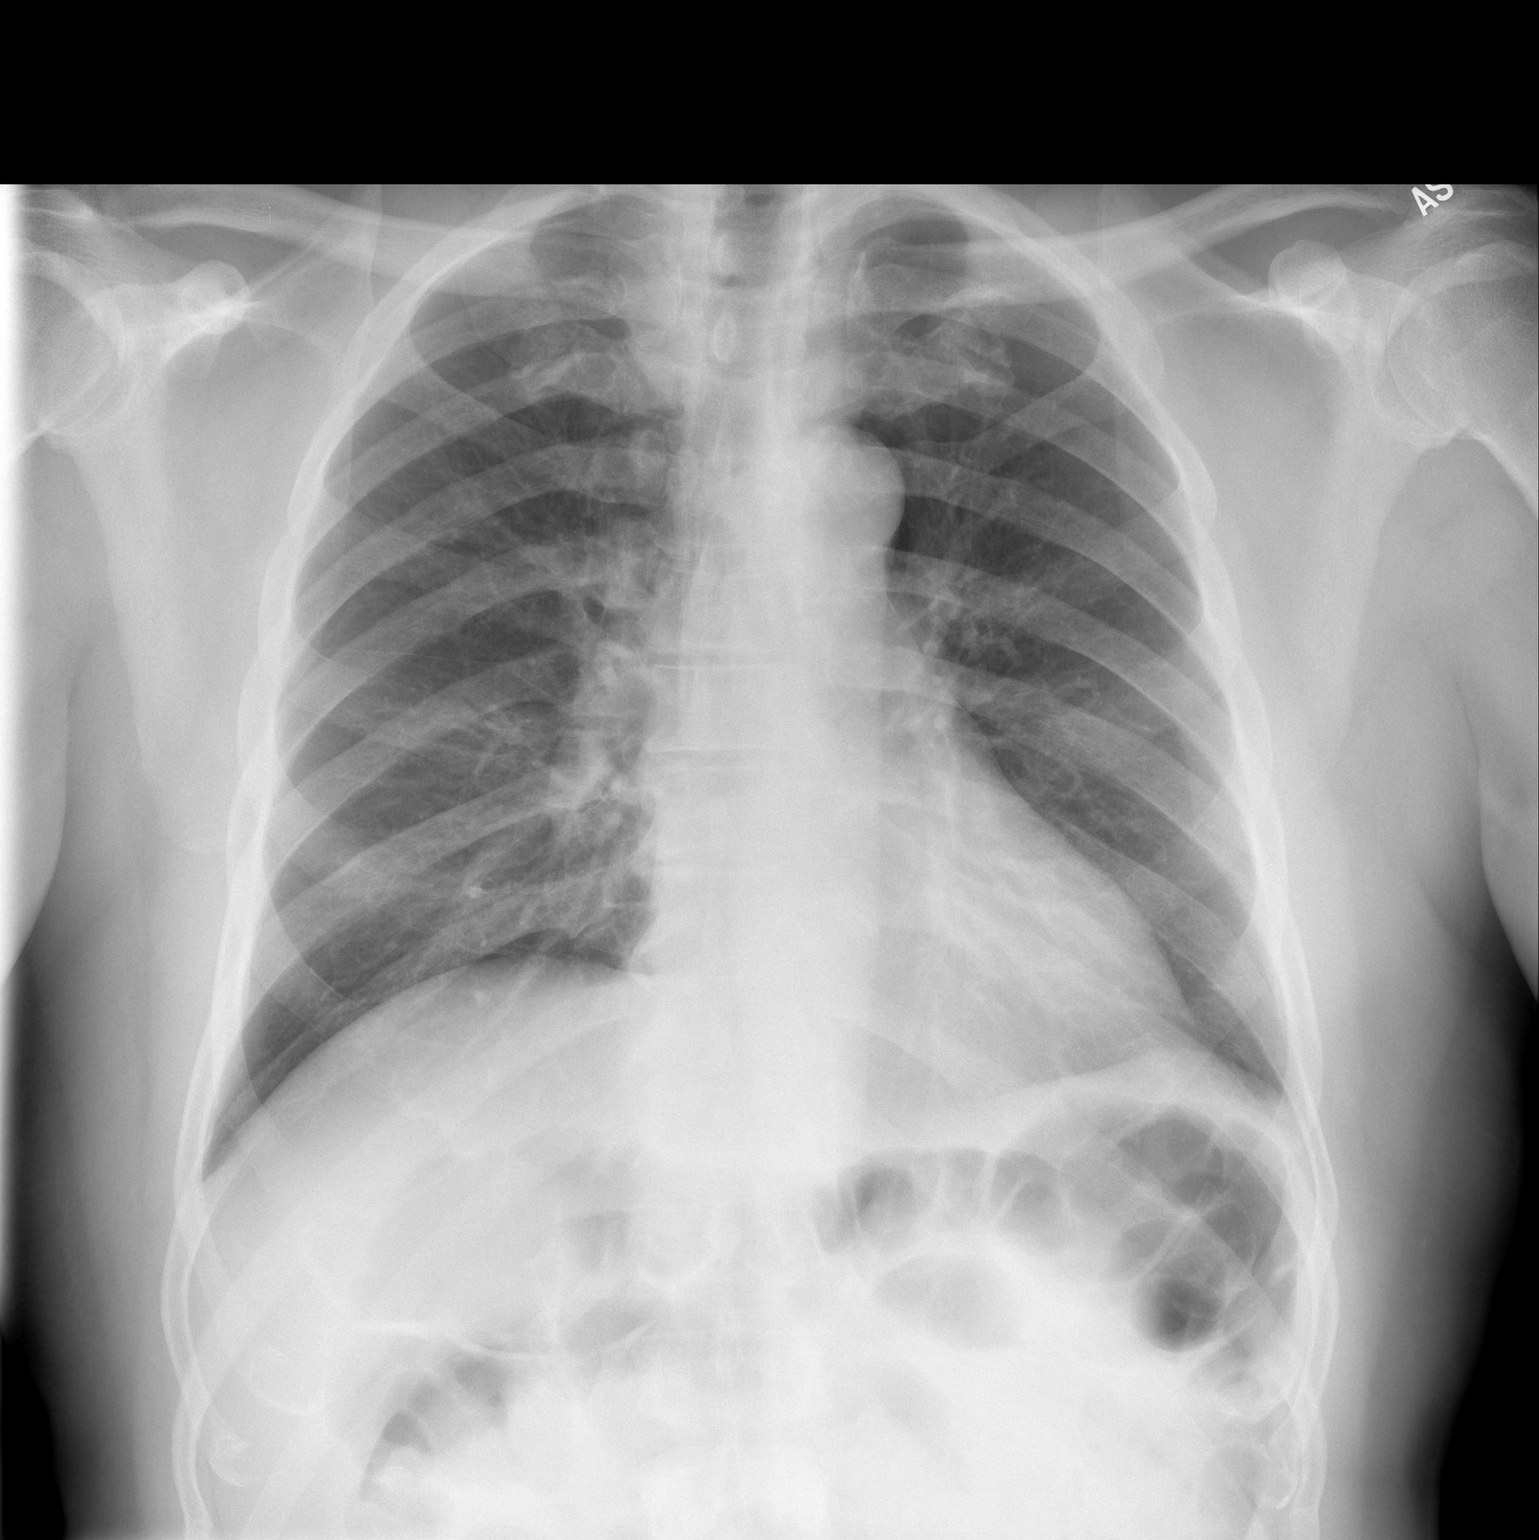

[w chest lat]
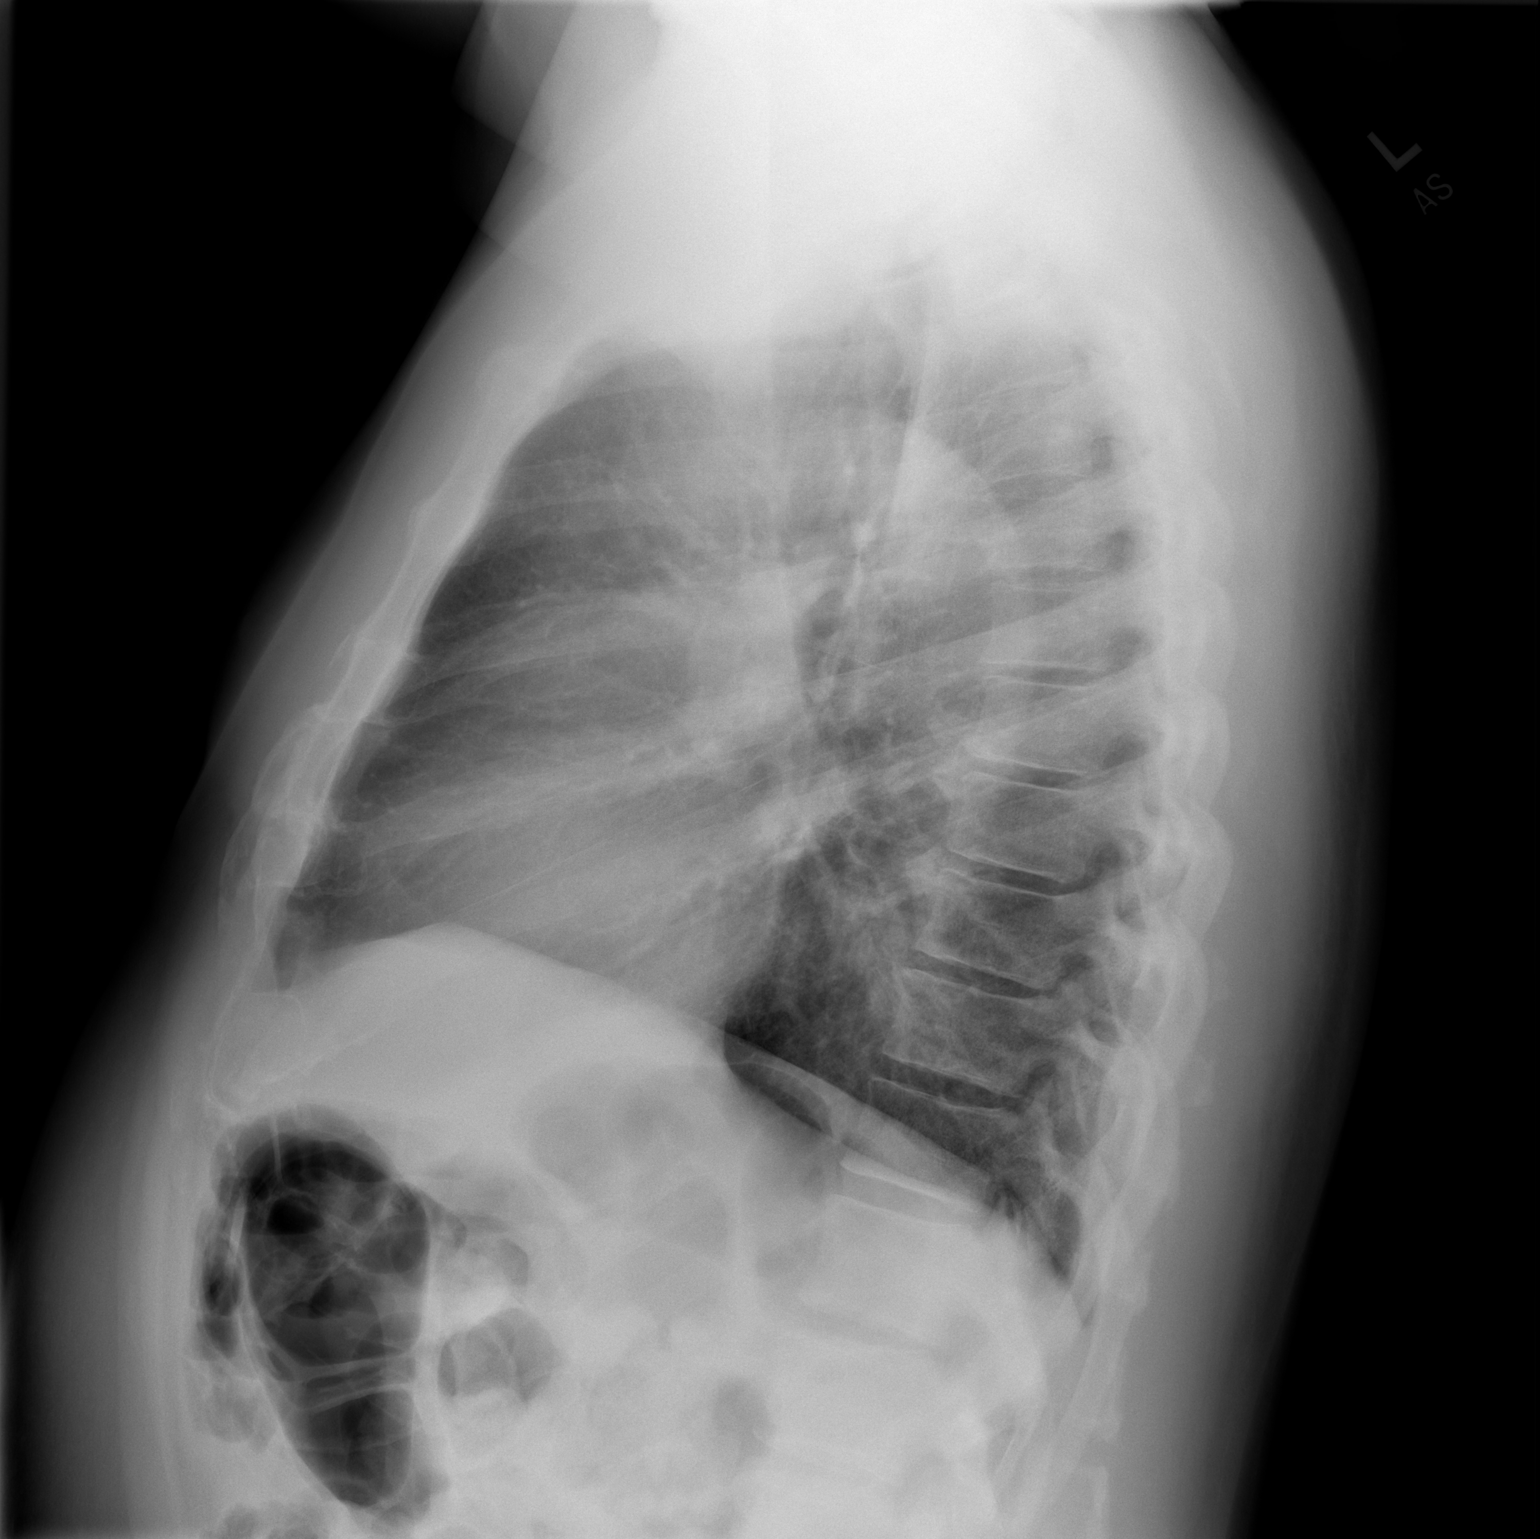

[2 of 2 positions shown; findings below may reference images not displayed]

FINDINGS: Cardiomediastinal silhouette unchanged in size and contour. No
evidence of central vascular congestion. No interlobular septal
thickening.

Stigmata of emphysema, with increased retrosternal airspace,
flattened hemidiaphragms, increased AP diameter, and hyperinflation
on the AP view.

No pneumothorax or pleural effusion. Coarsened interstitial
markings, with no confluent airspace disease.

No acute displaced fracture. Degenerative changes of the spine.
IMPRESSION: No active cardiopulmonary disease.

## 2023-05-23 DIAGNOSIS — M1711 Unilateral primary osteoarthritis, right knee: Secondary | ICD-10-CM | POA: Diagnosis not present

## 2023-06-19 DIAGNOSIS — H40053 Ocular hypertension, bilateral: Secondary | ICD-10-CM | POA: Diagnosis not present

## 2023-06-19 DIAGNOSIS — H40013 Open angle with borderline findings, low risk, bilateral: Secondary | ICD-10-CM | POA: Diagnosis not present
